# Patient Record
Sex: Male | Born: 1945 | Race: White | Hispanic: No | Marital: Single
Health system: Southern US, Community
[De-identification: ages and names within clinical notes are randomized; demographics above are authoritative.]

---

## 2013-12-13 LAB — TROPONIN I: Troponin-I: <0.02 ng/mL

## 2013-12-13 LAB — CBC
HCT: 36.1 % — ABNORMAL LOW (ref 40.0–52.0)
HGB: 11.4 g/dL — ABNORMAL LOW (ref 13.0–18.0)
MCH: 25.5 pg — ABNORMAL LOW (ref 26.0–34.0)
MCHC: 31.5 g/dL — AB (ref 32.0–36.0)
MCV: 81 fL (ref 80–100)
Platelet: 443 10*3/uL — ABNORMAL HIGH (ref 150–440)
RBC: 4.46 10*6/uL (ref 4.40–5.90)
RDW: 17.1 % — ABNORMAL HIGH (ref 11.5–14.5)
WBC: 10.3 10*3/uL (ref 3.8–10.6)

## 2013-12-13 LAB — COMPREHENSIVE METABOLIC PANEL
ANION GAP: 9 (ref 7–16)
AST: 17 U/L (ref 15–37)
Albumin: 3.6 g/dL (ref 3.4–5.0)
Alkaline Phosphatase: 102 U/L
BUN: 20 mg/dL — ABNORMAL HIGH (ref 7–18)
Bilirubin,Total: 0.4 mg/dL (ref 0.2–1.0)
CREATININE: 1.33 mg/dL — AB (ref 0.60–1.30)
Calcium, Total: 9.8 mg/dL (ref 8.5–10.1)
Chloride: 92 mmol/L — ABNORMAL LOW (ref 98–107)
Co2: 24 mmol/L (ref 21–32)
EGFR (Non-African Amer.): 55 — ABNORMAL LOW
GLUCOSE: 211 mg/dL — AB (ref 65–99)
Osmolality: 260 (ref 275–301)
POTASSIUM: 4 mmol/L (ref 3.5–5.1)
SGPT (ALT): 21 U/L (ref 12–78)
Sodium: 125 mmol/L — ABNORMAL LOW (ref 136–145)
TOTAL PROTEIN: 7.8 g/dL (ref 6.4–8.2)

## 2013-12-13 LAB — LIPASE, BLOOD: Lipase: 180 U/L (ref 73–393)

## 2013-12-14 ENCOUNTER — Inpatient Hospital Stay: Payer: Self-pay | Admitting: Family Medicine

## 2013-12-14 LAB — BASIC METABOLIC PANEL
Anion Gap: 7 (ref 7–16)
BUN: 16 mg/dL (ref 7–18)
CALCIUM: 9.4 mg/dL (ref 8.5–10.1)
Chloride: 98 mmol/L (ref 98–107)
Co2: 24 mmol/L (ref 21–32)
Creatinine: 1.21 mg/dL (ref 0.60–1.30)
EGFR (African American): >60
Glucose: 155 mg/dL — ABNORMAL HIGH (ref 65–99)
Osmolality: 263 (ref 275–301)
Potassium: 4.3 mmol/L (ref 3.5–5.1)
Sodium: 129 mmol/L — ABNORMAL LOW (ref 136–145)

## 2013-12-14 LAB — TROPONIN I: Troponin-I: <0.02 ng/mL

## 2013-12-15 LAB — CBC WITH DIFFERENTIAL/PLATELET
Basophil #: 0 10*3/uL (ref 0.0–0.1)
Basophil %: 0.1 %
Eosinophil #: 0.3 10*3/uL (ref 0.0–0.7)
Eosinophil %: 2.4 %
HCT: 34.6 % — AB (ref 40.0–52.0)
HGB: 11.2 g/dL — ABNORMAL LOW (ref 13.0–18.0)
Lymphocyte #: 0.7 10*3/uL — ABNORMAL LOW (ref 1.0–3.6)
Lymphocyte %: 5.3 %
MCH: 25.9 pg — ABNORMAL LOW (ref 26.0–34.0)
MCHC: 32.3 g/dL (ref 32.0–36.0)
MCV: 80 fL (ref 80–100)
Monocyte #: 1.2 x10 3/mm — ABNORMAL HIGH (ref 0.2–1.0)
Monocyte %: 9.4 %
Neutrophil #: 11 10*3/uL — ABNORMAL HIGH (ref 1.4–6.5)
Neutrophil %: 82.8 %
Platelet: 425 10*3/uL (ref 150–440)
RBC: 4.31 10*6/uL — ABNORMAL LOW (ref 4.40–5.90)
RDW: 17 % — AB (ref 11.5–14.5)
WBC: 13.3 10*3/uL — ABNORMAL HIGH (ref 3.8–10.6)

## 2013-12-15 LAB — BASIC METABOLIC PANEL
ANION GAP: 8 (ref 7–16)
BUN: 14 mg/dL (ref 7–18)
CALCIUM: 9.5 mg/dL (ref 8.5–10.1)
CREATININE: 1.19 mg/dL (ref 0.60–1.30)
Chloride: 99 mmol/L (ref 98–107)
Co2: 25 mmol/L (ref 21–32)
EGFR (Non-African Amer.): >60
Glucose: 146 mg/dL — ABNORMAL HIGH (ref 65–99)
Osmolality: 268 (ref 275–301)
Potassium: 3.7 mmol/L (ref 3.5–5.1)
SODIUM: 132 mmol/L — AB (ref 136–145)

## 2013-12-16 LAB — BASIC METABOLIC PANEL
Anion Gap: 9 (ref 7–16)
BUN: 14 mg/dL (ref 7–18)
CALCIUM: 9.1 mg/dL (ref 8.5–10.1)
Chloride: 98 mmol/L (ref 98–107)
Co2: 24 mmol/L (ref 21–32)
Creatinine: 1.02 mg/dL (ref 0.60–1.30)
EGFR (African American): >60
GLUCOSE: 168 mg/dL — AB (ref 65–99)
OSMOLALITY: 267 (ref 275–301)
Potassium: 3.4 mmol/L — ABNORMAL LOW (ref 3.5–5.1)
Sodium: 131 mmol/L — ABNORMAL LOW (ref 136–145)

## 2013-12-16 LAB — CBC WITH DIFFERENTIAL/PLATELET
BASOS ABS: 0 10*3/uL (ref 0.0–0.1)
Basophil %: 0.2 %
EOS ABS: 0.1 10*3/uL (ref 0.0–0.7)
Eosinophil %: 1 %
HCT: 34 % — ABNORMAL LOW (ref 40.0–52.0)
HGB: 11 g/dL — ABNORMAL LOW (ref 13.0–18.0)
LYMPHS ABS: 0.8 10*3/uL — AB (ref 1.0–3.6)
Lymphocyte %: 8.2 %
MCH: 26.1 pg (ref 26.0–34.0)
MCHC: 32.4 g/dL (ref 32.0–36.0)
MCV: 81 fL (ref 80–100)
MONO ABS: 0.8 x10 3/mm (ref 0.2–1.0)
MONOS PCT: 8.7 %
Neutrophil #: 7.8 10*3/uL — ABNORMAL HIGH (ref 1.4–6.5)
Neutrophil %: 81.9 %
PLATELETS: 381 10*3/uL (ref 150–440)
RBC: 4.22 10*6/uL — AB (ref 4.40–5.90)
RDW: 17 % — ABNORMAL HIGH (ref 11.5–14.5)
WBC: 9.5 10*3/uL (ref 3.8–10.6)

## 2013-12-16 LAB — MAGNESIUM: Magnesium: 1.8 mg/dL

## 2013-12-17 LAB — BASIC METABOLIC PANEL
ANION GAP: 9 (ref 7–16)
BUN: 19 mg/dL — ABNORMAL HIGH (ref 7–18)
CALCIUM: 9 mg/dL (ref 8.5–10.1)
CHLORIDE: 100 mmol/L (ref 98–107)
Co2: 24 mmol/L (ref 21–32)
Creatinine: 1.28 mg/dL (ref 0.60–1.30)
EGFR (Non-African Amer.): 57 — ABNORMAL LOW
GLUCOSE: 148 mg/dL — AB (ref 65–99)
Osmolality: 271 (ref 275–301)
Potassium: 3.2 mmol/L — ABNORMAL LOW (ref 3.5–5.1)
SODIUM: 133 mmol/L — AB (ref 136–145)

## 2013-12-17 LAB — CBC WITH DIFFERENTIAL/PLATELET
BASOS PCT: 0.5 %
Basophil #: 0.1 10*3/uL (ref 0.0–0.1)
EOS ABS: 0.5 10*3/uL (ref 0.0–0.7)
Eosinophil %: 4.6 %
HCT: 33.4 % — AB (ref 40.0–52.0)
HGB: 10.7 g/dL — ABNORMAL LOW (ref 13.0–18.0)
LYMPHS PCT: 10 %
Lymphocyte #: 1 10*3/uL (ref 1.0–3.6)
MCH: 25.9 pg — ABNORMAL LOW (ref 26.0–34.0)
MCHC: 32.1 g/dL (ref 32.0–36.0)
MCV: 81 fL (ref 80–100)
MONO ABS: 1.1 x10 3/mm — AB (ref 0.2–1.0)
Monocyte %: 11.2 %
Neutrophil #: 7.5 10*3/uL — ABNORMAL HIGH (ref 1.4–6.5)
Neutrophil %: 73.7 %
PLATELETS: 397 10*3/uL (ref 150–440)
RBC: 4.14 10*6/uL — AB (ref 4.40–5.90)
RDW: 17.5 % — ABNORMAL HIGH (ref 11.5–14.5)
WBC: 10.2 10*3/uL (ref 3.8–10.6)

## 2013-12-19 LAB — PATHOLOGY REPORT

## 2014-01-29 DEATH — deceased
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
## 2014-12-22 NOTE — Discharge Summary (Signed)
PATIENT NAME:  [name], Robert R MR#:  [id number] DATE OF BIRTH:  [birth year]  DATE OF ADMISSION:  12/14/2013 DATE OF DISCHARGE:    ADDENDUM  Addendum to discharge.  Please refer to the discharge summary dictated yesterday for [name]. [name] is very nice 69 year old gentleman. We discharged him yesterday for [place], but the patient was not able to go due to transportation issues. At this moment, we were not able to get the full transportation early this morning, but finally we were able to arrange the right care for the patient and he is going to be going  soon. There has not been any acute issues overnight. The patient is going [place], [facility].    ____________________________ Felipa Furnaceoberto Sanchez Gutierrez, MD rsg:sg D: 12/18/2013 11:24:10 ET T: 12/18/2013 12:13:53 ET JOB#: [id number]  cc: Felipa Furnaceoberto Sanchez Gutierrez, MD, <Dictator> Zonie Crutcher Juanda ChanceSANCHEZ GUTIERRE MD ELECTRONICALLY SIGNED 01/02/2014 22:55

## 2014-12-22 NOTE — Consult Note (Signed)
Chief Complaint:  Subjective/Chief Complaint Pt still has RLQ pain 5/10 at worst.  Denies nausea or vomiting.  He had icecream last night but no breakfast yet this AM.   VITAL SIGNS/ANCILLARY NOTES: **Vital Signs.:   17-Apr-15 11:25  Vital Signs Type Routine  Temperature Temperature (F) 99.4  Celsius 37.4  Temperature Source oral  Pulse Pulse 82  Respirations Respirations 18  Systolic BP Systolic BP 076  Diastolic BP (mmHg) Diastolic BP (mmHg) 80  Mean BP 103  Pulse Ox % Pulse Ox % 96  Pulse Ox Activity Level  At rest  Oxygen Delivery Room Air/ 21 %   Brief Assessment:  GEN well developed, no acute distress, A/Ox3   Cardiac Regular   Respiratory normal resp effort   Gastrointestinal details normal Soft  Nondistended  Bowel sounds normal  No rebound tenderness  No gaurding  +mild RLQ tenderness to deep palpation   EXTR negative cyanosis/clubbing, negative edema   Additional Physical Exam Skin: pink, warm, dry HEENT: sclera clear, no icterus   Lab Results:  Routine Chem:  17-Apr-15 04:49   Glucose, Serum  146  BUN 14  Creatinine (comp) 1.19  Sodium, Serum  132  Potassium, Serum 3.7  Chloride, Serum 99  CO2, Serum 25  Calcium (Total), Serum 9.5  Anion Gap 8  Osmolality (calc) 268  eGFR (African American) >60  eGFR (Non-African American) >60 (eGFR values <65m/min/1.73 m2 may be an indication of chronic kidney disease (CKD). Calculated eGFR is useful in patients with stable renal function. The eGFR calculation will not be reliable in acutely ill patients when serum creatinine is changing rapidly. It is not useful in  patients on dialysis. The eGFR calculation may not be applicable to patients at the low and high extremes of body sizes, pregnant women, and vegetarians.)  Routine Hem:  17-Apr-15 04:49   WBC (CBC)  13.3  RBC (CBC)  4.31  Hemoglobin (CBC)  11.2  Hematocrit (CBC)  34.6  Platelet Count (CBC) 425  MCV 80  MCH  25.9  MCHC 32.3  RDW  17.0   Neutrophil % 82.8  Lymphocyte % 5.3  Monocyte % 9.4  Eosinophil % 2.4  Basophil % 0.1  Neutrophil #  11.0  Lymphocyte #  0.7  Monocyte #  1.2  Eosinophil # 0.3  Basophil # 0.0 (Result(s) reported on 15 Dec 2013 at 05:30AM.)   Assessment/Plan:  Assessment/Plan:  Assessment Recurrent duodenal stricture status post dilation: Likely cause of gastric putlet obstruction, Resolved Esophageal stricture s/p dilation Acute on chronic abdominal pain:  Much improved.  Multifactorial.  Hx bladder cancer & axillary lymphadenopathy currently undergoing oncology work-up, [place]. His care has been discussed with Dr DLucilla Lame   Plan 1) Continue Protonix 40 mg b.i.d.  2) supportive meaasures 3) Follow up with oncology in [place] 4) Resume lovenox Please call if you have any questions or concerns. Dr [facility] over [facility].   Electronic Signatures: JAndria Meuse(NP)  (Signed 17-Apr-15 12:20)  Authored: Chief Complaint, VITAL SIGNS/ANCILLARY NOTES, Brief Assessment, Lab Results, Assessment/Plan   Last Updated: 17-Apr-15 12:20 by JAndria Meuse(NP)

## 2014-12-22 NOTE — Consult Note (Signed)
Chief Complaint:  Subjective/Chief Complaint seen for nausea vomiting abdominal pain.  did not tolerate meals last night and this am.  recurrent n/v and continued abdominal pain. small bm.   VITAL SIGNS/ANCILLARY NOTES: **Vital Signs.:   18-Apr-15 05:30  Temperature Temperature (F) 98.7  Celsius 37  Pulse Pulse 99  Respirations Respirations 21  Systolic BP Systolic BP 161  Diastolic BP (mmHg) Diastolic BP (mmHg) 97  Mean BP 115  Pulse Ox % Pulse Ox % 97  Pulse Ox Activity Level  At rest  Oxygen Delivery Room Air/ 21 %    11:36  Vital Signs Type Routine  Temperature Temperature (F) 98.4  Celsius 36.8  Temperature Source oral  Pulse Pulse 84  Respirations Respirations 18  Systolic BP Systolic BP 096  Diastolic BP (mmHg) Diastolic BP (mmHg) 81  Mean BP 107  Pulse Ox % Pulse Ox % 96  Pulse Ox Activity Level  At rest  Oxygen Delivery Room Air/ 21 %  *Intake and Output.:   18-Apr-15 01:00  Emesis ml     Out:  1    Shift 07:00  Emesis ml     Out:  1    Daily 07:00  Emesis ml     Out:  1    09:45  Stool  loose    10:44  Emesis ml     Out:  1    Shift 15:00  Emesis ml     Out:  1   Brief Assessment:  Cardiac Regular   Respiratory clear BS   Gastrointestinal details normal mild distension, diffuse tenderness, possible positive peritoneal signs.   Additional Physical Exam NGT in place without material in canister, minimal in ngt   Lab Results: Routine Chem:  18-Apr-15 04:19   Glucose, Serum  168  BUN 14  Creatinine (comp) 1.02  Sodium, Serum  131  Potassium, Serum  3.4  Chloride, Serum 98  CO2, Serum 24  Calcium (Total), Serum 9.1  Anion Gap 9  Osmolality (calc) 267  eGFR (African American) >60  eGFR (Non-African American) >60 (eGFR values <33m/min/1.73 m2 may be an indication of chronic kidney disease (CKD). Calculated eGFR is useful in patients with stable renal function. The eGFR calculation will not be reliable in acutely ill patients when serum  creatinine is changing rapidly. It is not useful in  patients on dialysis. The eGFR calculation may not be applicable to patients at the low and high extremes of body sizes, pregnant women, and vegetarians.)  Magnesium, Serum 1.8 (1.8-2.4 THERAPEUTIC RANGE: 4-7 mg/dL TOXIC: > 10 mg/dL  -----------------------)  Routine Hem:  15-Apr-15 22:12   WBC (CBC) 10.3  17-Apr-15 04:49   WBC (CBC)  13.3  18-Apr-15 04:19   WBC (CBC) 9.5  RBC (CBC)  4.22  Hemoglobin (CBC)  11.0  Hematocrit (CBC)  34.0  Platelet Count (CBC) 381  MCV 81  MCH 26.1  MCHC 32.4  RDW  17.0  Neutrophil % 81.9  Lymphocyte % 8.2  Monocyte % 8.7  Eosinophil % 1.0  Basophil % 0.2  Neutrophil #  7.8  Lymphocyte #  0.8  Monocyte # 0.8  Eosinophil # 0.1  Basophil # 0.0 (Result(s) reported on 16 Dec 2013 at 05:39AM.)   Radiology Results: CT:    16-Apr-15 00:53, CT Abdomen and Pelvis With Contrast  CT Abdomen and Pelvis With Contrast   REASON FOR EXAM:    (1) generalized abdominal pain, nausea; (2)   generalized abdominal pain, nausea  COMMENTS:  PROCEDURE: CT  - CT ABDOMEN / PELVIS  W  - Dec 14 2013 12:53AM     CLINICAL DATA:  Abdominal and chest pain.  Nausea.    EXAM:  CT ABDOMEN AND PELVIS WITH CONTRAST    TECHNIQUE:  Multidetector CT imaging of the abdomen and pelvis was performed  using the standard protocol following bolus administration of  intravenous contrast.  CONTRAST:  100 cc Isovue-300    COMPARISON:  None.    FINDINGS:  There is linear atelectasis or scar in the left lower lobe. The  right lung bases clear. No pleural or pericardial fluid. There is  coronary artery calcification.    The liver does not show any focal lesions. There is mild biliary  ductalprominence. No calcified gallstones. The extrahepatic bile  duct does not appear dilated. There are areas of low density in  volume loss in the spleen likely related to old infarctions.    There is marked inflammation affecting  the third and fourth portions  of the duodenum. There is retroperitoneal edema emanating outward  from that. Though adjacent to the inferior aspect of the head of the  pancreas, I do not think that this arises from the pancreas.  Presumably this is due to acid peptic disease. I would expect that  this would cause relative gastric outlet obstruction.    The adrenal glands are normal. The left kidney shows a few scar is  but no active or significant finding. The right renal parenchyma is  normal, but there is hydronephrosis. There is a soft tissue mass of  the proximal right ureter. There is marked asymmetric wall  thickening of the bladder are on the left. 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    A rectal mass it is not excluded. There is a large amount of fecal  matter and gas within the colon.    There is atherosclerosis of the aorta and its branch vessels. Mild  aneurysmal dilatation of the infrarenal aorta with a maximal  diameter of 2.6 cm.    There chronic degenerative changes of the lower lumbar spine.     IMPRESSION:  Marked inflammatory process of the third and fourth portions of the  duodenum consistent with duodenitis presumably due to acid peptic  disease. Mass lesion is not excluded but not favored.  Retroperitoneal edema extending out from the head. Relative gastric  outlet obstruction.  Right hydronephrosis. Tumor in the proximal right ureter. Tumor  along the left side of the bladder. 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      Electronically Signed    By: Nelson Chimes M.D.    On: 12/14/2013 01:12         Verified By: Jules Schick, M.D.,   Assessment/Plan:  Assessment/Plan:  Assessment 1) n/v generalized abdominal pain.  egd with dilation of narrowing of the 2nd portion of the duodenum 2 d ago.  recurrent nausea/emesis since. review of ct from admission  shows marked inflammation around the 3-4 portion of the duodenum with luminal narrowing.  with recurrent/continued intolerance of po intake, likely continued at least partial obstruction of the duodenum as noted.  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  Abnormal ct of bladder, right ureter and third to fourth portion of the duodenum.   Plan 1) after several discussions with Dr Laurin Coder and personal and radiology review  of films 2 options-further evaluation via surgery and Oncology here or transfer back to [facility]) where ongoing evaluation of the current clinical situation was started, as he is only visiting here.  Obtaining results of PET done recently at [facility] have been requested.  continue bid ppi. Following.   Electronic Signatures: Loistine Simas (MD)  (Signed 18-Apr-15 15:42)  Authored: Chief Complaint, VITAL SIGNS/ANCILLARY NOTES, Brief Assessment, Lab Results, Radiology Results, Assessment/Plan   Last Updated: 18-Apr-15 15:42 by Loistine Simas (MD)

## 2014-12-22 NOTE — Consult Note (Signed)
Brief Consult Note: Diagnosis: duodenitis, chronic abd pain, nausea & vomiting.   Patient was seen by consultant.   Comments: Mr. 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  Duodenitis & probable gastric outlet obstruction seen on CT along with tumors in bladder & right ureter.  Urology has been consulted as well.  EGD & colonoscopy benign per patient last month.  Discussed with Dr Servando SnareWohl & we have offered patient EGD with possible dilation &/or biopsy of duodenum later today.  Discussed risks/benefits of procedure which include but are not limited to bleeding, infection, perforation & drug reaction.  Patient agrees with this plan & consent will be obtained.  Plan: 1) EGD with Dr Servando SnareWohl 2) Protonix 40mg  BID 3) Stop Lovenox 12 hrs prior to procedure 4) Agree with urology consult 5) Follow up with oncology in [place] 6) Supportive measures Thanks for allowing us to participate in his care.  Please see full dictated note. #[id number].  Electronic Signatures: Joselyn ArrowJones, Keondrick Dilks L (NP)  (Signed 16-Apr-15 13:13)  Authored: Brief Consult Note   Last Updated: 16-Apr-15 13:13 by Joselyn ArrowJones, Flor Whitacre L (NP)

## 2014-12-22 NOTE — Consult Note (Signed)
Brief Consult Note: Diagnosis: aquired benign duodenal stenosis - dilated yesterday.   Comments: will sign off. please reconsult if he is found tio have any surgical needs.  Electronic Signatures: Claude MangesMarterre, Franci Oshana F (MD)  (Signed 17-Apr-15 07:40)  Authored: Brief Consult Note   Last Updated: 17-Apr-15 07:40 by Claude MangesMarterre, Kerron Sedano F (MD)
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
## 2015-01-06 IMAGING — CT CT ABD-PELV W/ CM
2 of 5 series · 16 of 46 positions shown, 18 images · IV contrast (agent unspecified)
Comparison: None.

CLINICAL DATA: Abdominal and chest pain.  Nausea.

EXAM:
CT ABDOMEN AND PELVIS WITH CONTRAST
TECHNIQUE: Multidetector CT imaging of the abdomen and pelvis was performed
using the standard protocol following bolus administration of
intravenous contrast.
CONTRAST:  100 cc Dsovue-YJJ

[Series 2: routine abd pel with · axial · 0.76mm/px · z∈[-888,-454]mm · 13 of 97 slices shown, 15 images]
[im 5/97  soft-tissue]
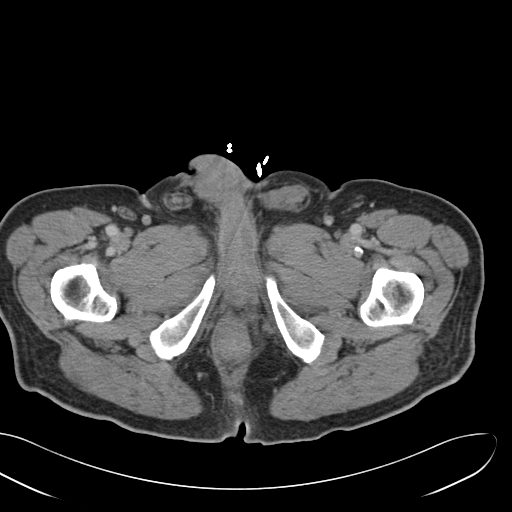
[im 5/97  bone]
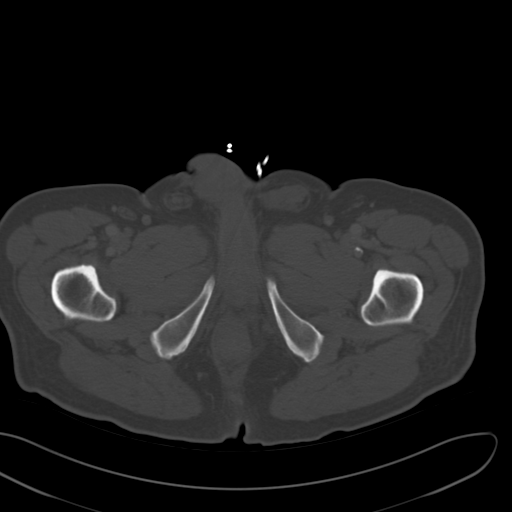
[im 15/97  soft-tissue]
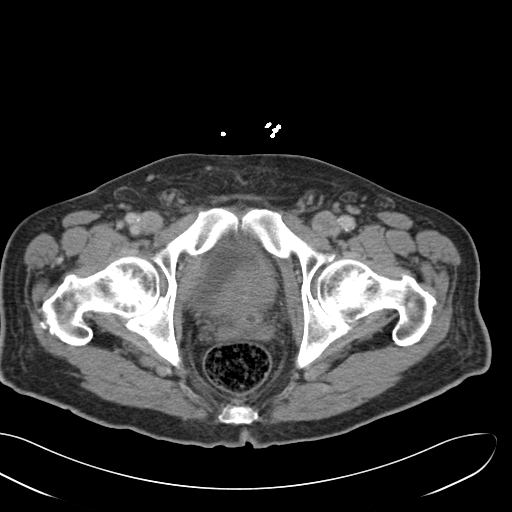
[im 20/97  soft-tissue]
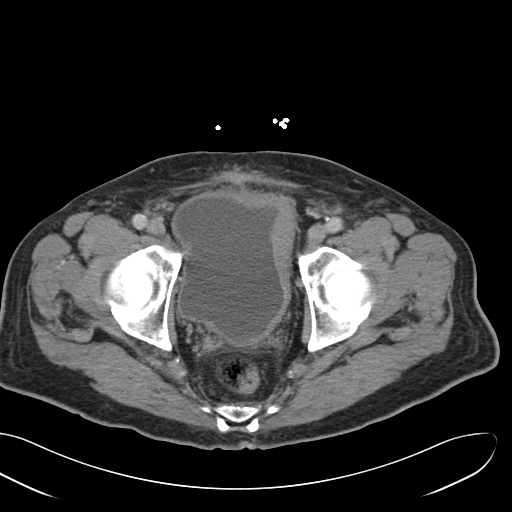
[im 29/97  soft-tissue]
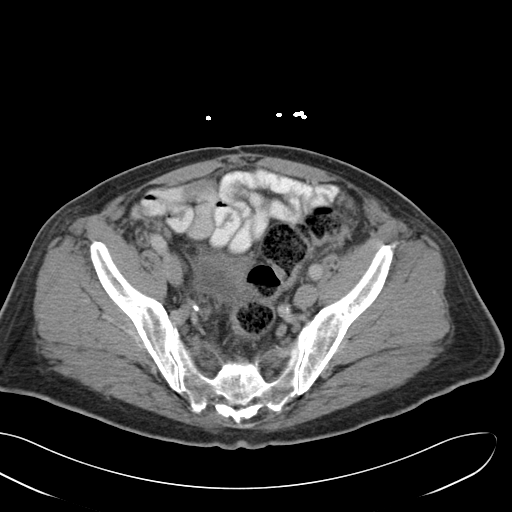
[im 34/97  soft-tissue]
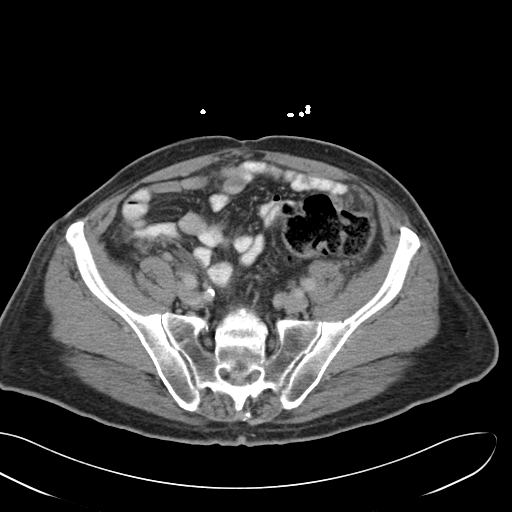
[im 44/97  soft-tissue]
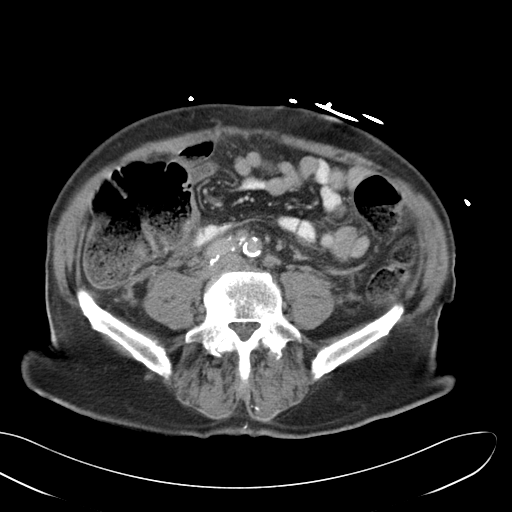
[im 49/97  soft-tissue]
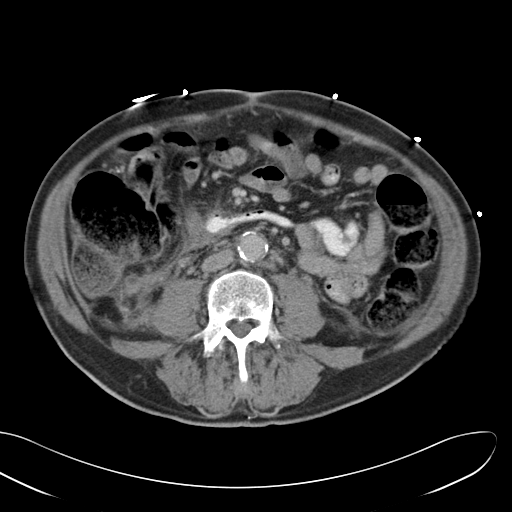
[im 53/97  soft-tissue]
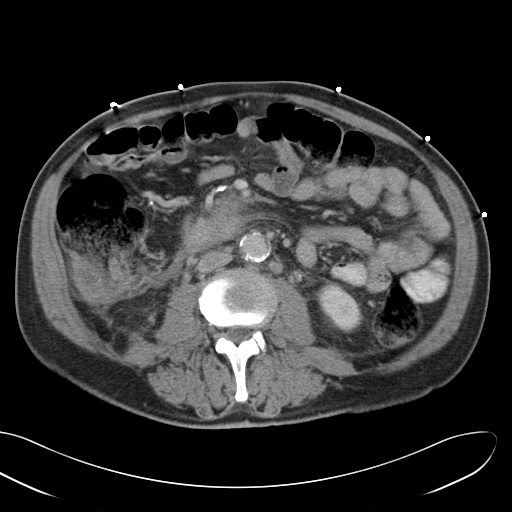
[im 63/97  soft-tissue]
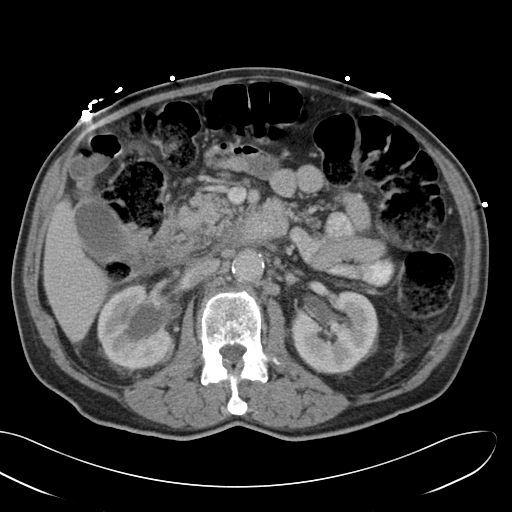
[im 63/97  bone]
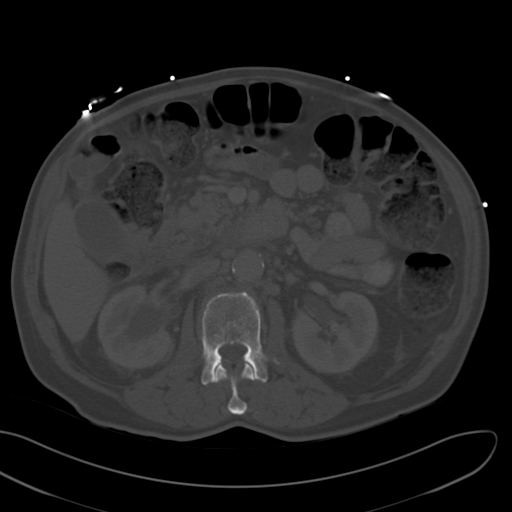
[im 68/97  soft-tissue]
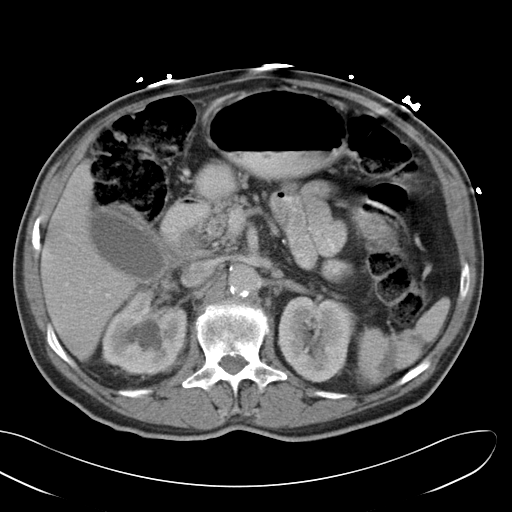
[im 77/97  soft-tissue]
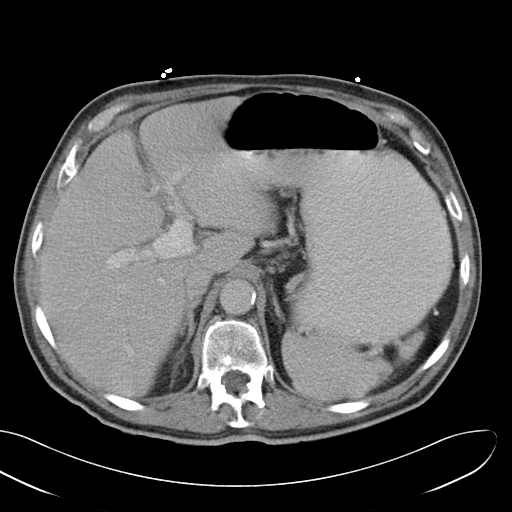
[im 82/97  soft-tissue]
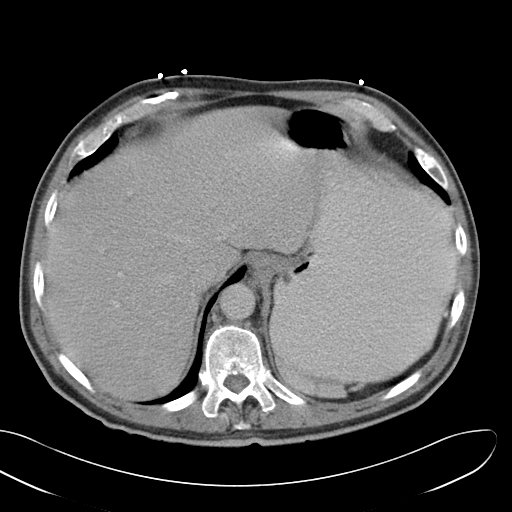
[im 92/97  soft-tissue]
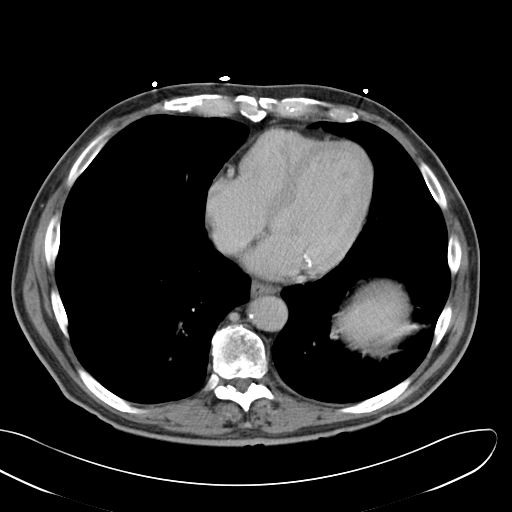

[Series 6: cor routine abd pel with · coronal · 0.98mm/px · 3 of 139 slices shown]
[im 47/139  soft-tissue]
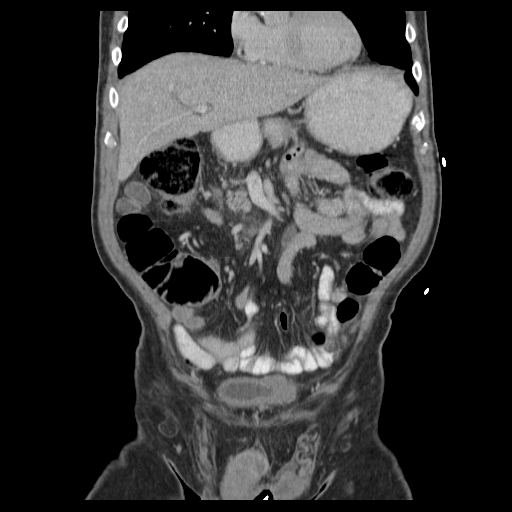
[im 62/139  soft-tissue]
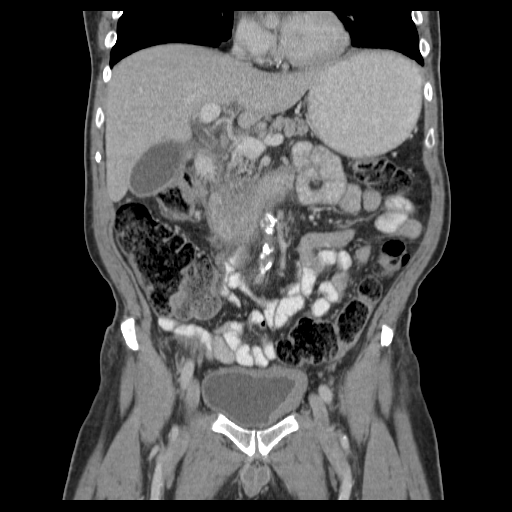
[im 77/139  soft-tissue]
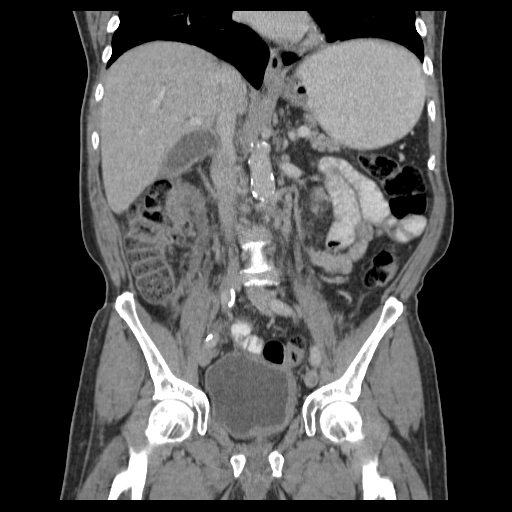

[16 of 46 positions shown; findings below may reference images not displayed]

FINDINGS: There is linear atelectasis or scar in the left lower lobe. The
right lung bases clear. No pleural or pericardial fluid. There is
coronary artery calcification.

The liver does not show any focal lesions. There is mild biliary
ductal prominence. No calcified gallstones. The extrahepatic bile
duct does not appear dilated. There are areas of low density in
volume loss in the spleen likely related to old infarctions.

There is marked inflammation affecting the third and fourth portions
of the duodenum. There is retroperitoneal edema emanating outward
from that. Though adjacent to the inferior aspect of the head of the
pancreas, I do not think that this arises from the pancreas.
Presumably this is due to acid peptic disease. I would expect that
this would cause relative gastric outlet obstruction.

The adrenal glands are normal. The left kidney shows a few scar is
but no active or significant finding. The right renal parenchyma is
normal, but there is hydronephrosis. There is a soft tissue mass of
the proximal right ureter. There is marked asymmetric wall
thickening of the bladder are on the left. Therefore, this patient
probably has transitional carcinoma of the bladder and the proximal
right ureter.

A rectal mass it is not excluded. There is a large amount of fecal
matter and gas within the colon.

There is atherosclerosis of the aorta and its branch vessels. Mild
aneurysmal dilatation of the infrarenal aorta with a maximal
diameter of 2.6 cm.

There chronic degenerative changes of the lower lumbar spine.
IMPRESSION: Marked inflammatory process of the third and fourth portions of the
duodenum consistent with duodenitis presumably due to acid peptic
disease. Mass lesion is not excluded but not favored.
Retroperitoneal edema extending out from the head. Relative gastric
outlet obstruction.

Right hydronephrosis. Tumor in the proximal right ureter. Tumor
along the left side of the bladder. Findings are consistent with
multifocal transitional cell cancer.
